# Patient Record
Sex: Male | Born: 1985 | Hispanic: Yes | Marital: Single
Health system: Southern US, Community
[De-identification: ages and names within clinical notes are randomized; demographics above are authoritative.]

---

## 2018-03-05 ENCOUNTER — Emergency Department (HOSPITAL_COMMUNITY): Payer: Self-pay

## 2018-03-05 ENCOUNTER — Encounter (HOSPITAL_COMMUNITY): Payer: Self-pay | Admitting: Emergency Medicine

## 2018-03-05 ENCOUNTER — Emergency Department (HOSPITAL_COMMUNITY)
Admission: EM | Admit: 2018-03-05 | Discharge: 2018-03-05 | Disposition: A | Discharge status: Home / Self Care | Payer: Self-pay | Attending: Emergency Medicine | Admitting: Emergency Medicine

## 2018-03-05 DIAGNOSIS — Y929 Unspecified place or not applicable: Secondary | ICD-10-CM | POA: Insufficient documentation

## 2018-03-05 DIAGNOSIS — Y999 Unspecified external cause status: Secondary | ICD-10-CM | POA: Insufficient documentation

## 2018-03-05 DIAGNOSIS — S92424A Nondisplaced fracture of distal phalanx of right great toe, initial encounter for closed fracture: Secondary | ICD-10-CM | POA: Insufficient documentation

## 2018-03-05 DIAGNOSIS — W208XXA Other cause of strike by thrown, projected or falling object, initial encounter: Secondary | ICD-10-CM | POA: Insufficient documentation

## 2018-03-05 DIAGNOSIS — Y939 Activity, unspecified: Secondary | ICD-10-CM | POA: Insufficient documentation

## 2018-03-05 NOTE — Discharge Instructions (Signed)
Buddy tape your toes for comfort, use the CAM walker for all weight bearing activities.  Ice and elevate the toe throughout the day, using ice pack for no more than 20 minutes every hour.  Alternate between tylenol and ibuprofen for pain relief. Follow up with your regular doctor in 1 week for recheck of symptoms. Return to the ER for changes or worsening symptoms.

## 2018-03-05 NOTE — ED Provider Notes (Signed)
[facility]-EMERGENCY DEPT Provider Note   CSN: [id number] Arrival date & time: 03/05/18  1657     History   Chief Complaint Chief Complaint  Patient presents with  . Toe Injury    HPI Vincent Bell is a 32 y.o. otherwise healthy male, who presents to the ED with complaints of right great toe pain since yesterday after he accidentally dropped a heavy aluminum lighting truss on his great toe from about a foot off the ground.  He describes the pain as 7/10 intermittent throbbing nonradiating right great toe pain which worsens with movement of the toe and walking, and has been moderately improved with Tylenol and ice.  He reports associated right great toe swelling and bruising.  He denies any wounds, numbness, tingling, focal weakness, or any other complaints or injuries at this time.  The history is provided by the patient and medical records. No language interpreter was used.  Toe Pain  This is a new problem. The current episode started yesterday. The problem occurs daily. The problem has not changed since onset.The symptoms are aggravated by walking and standing. The symptoms are relieved by acetaminophen and ice. He has tried acetaminophen and a cold compress for the symptoms. The treatment provided moderate relief.    History reviewed. No pertinent past medical history.  There are no active problems to display for this patient.   History reviewed. No pertinent surgical history.      Home Medications    Prior to Admission medications   Not on File    Family History History reviewed. No pertinent family history.  Social History Social History   Tobacco Use  . Smoking status: Not on file  Substance Use Topics  . Alcohol use: Not on file  . Drug use: Not on file     Allergies   Patient has no allergy information on record.   Review of Systems Review of Systems  Musculoskeletal: Positive for arthralgias and joint swelling.  Skin: Positive  for color change. Negative for wound.  Allergic/Immunologic: Negative for immunocompromised state.  Neurological: Negative for weakness and numbness.  Psychiatric/Behavioral: Negative for confusion.     Physical Exam Updated Vital Signs BP 135/79 (BP Location: Left Arm)   Pulse 61   Temp 98.2 F (36.8 C) (Oral)   Resp 18   SpO2 100%   Physical Exam  Constitutional: He is oriented to person, place, and time. Vital signs are normal. He appears well-developed and well-nourished.  Non-toxic appearance. No distress.  Afebrile, nontoxic, NAD  HENT:  Head: Normocephalic and atraumatic.  Mouth/Throat: Mucous membranes are normal.  Eyes: Conjunctivae and EOM are normal. Right eye exhibits no discharge. Left eye exhibits no discharge.  Neck: Normal range of motion. Neck supple.  Cardiovascular: Normal rate and intact distal pulses.  Pulmonary/Chest: Effort normal. No respiratory distress.  Abdominal: Normal appearance. He exhibits no distension.  Musculoskeletal:       Right foot: There is decreased range of motion (due to pain), tenderness, bony tenderness and swelling. There is normal capillary refill, no crepitus, no deformity and no laceration.  R foot with mild swelling and significant bruising to R great toe, moderate TTP to the great toe only but no other areas of tenderness to remainder of foot/toes, no crepitus/deformity, no wounds, still able to wiggle toes slightly but somewhat limited due to pain. Sensation grossly intact, distal pulses and cap refill intact, soft compartments.   Neurological: He is alert and oriented to person, place, and  time. He has normal strength. No sensory deficit.  Skin: Skin is warm, dry and intact. No rash noted.  Psychiatric: He has a normal mood and affect.  Nursing note and vitals reviewed.    ED Treatments / Results  Labs (all labs ordered are listed, but only abnormal results are displayed) Labs Reviewed - No data to  display  EKG None  Radiology Dg Foot Complete Right  Result Date: 03/05/2018 CLINICAL DATA:  Patient dropped something on the great total yesterday. Hematoma of the great toe. EXAM: RIGHT FOOT COMPLETE - 3+ VIEW COMPARISON:  None. FINDINGS: There are fractures involving the tuft and lateral base of the distal phalanx of the right great toe. There is intra-articular extension of the fracture at the base of the great toe into the interphalangeal joint. No joint dislocation is identified. Soft tissue swelling of the forefoot is noted more so along the great toe. A bipartite lateral sesamoid is seen at the head of the first metatarsal characterized by it's sclerotic margins. Deformity and truncated appearance of the second distal phalangeal tuft appears to be related to old remote trauma. IMPRESSION: 1. There are acute appearing fractures involving the tuft and lateral base of the distal phalanx of the right great toe. There is intra-articular extension of the fracture at the base of the great toe into the interphalangeal joint. 2. Soft tissue swelling of the forefoot. Electronically Signed   By: Tollie Ethavid  Kwon M.D.   On: 03/05/2018 17:51    Procedures Procedures (including critical care time)  Medications Ordered in ED Medications - No data to display   Initial Impression / Assessment and Plan / ED Course  I have reviewed the triage vital signs and the nursing notes.  Pertinent labs & imaging results that were available during my care of the patient were reviewed by me and considered in my medical decision making (see chart for details).     32 y.o. male here with R great toe injury sustained yesterday when he dropped an aluminum heavy object on his toe. On exam, mild swelling and significant bruising to R great toe, moderate TTP, no crepitus/deformity, no wounds, still able to wiggle toes slightly but limited due to pain; NVI with soft compartments. No other areas of tenderness to the remainder of  the foot/toes. Xray shows fx of tuft and lateral base of distal phalanx with some intraarticular extension. Will buddy tape and give CAM walker for comfort with weight bearing, advised tylenol/motrin/RICE, and f/up with his PCP in 1wk. I explained the diagnosis and have given explicit precautions to return to the ER including for any other new or worsening symptoms. The patient understands and accepts the medical plan as it's been dictated and I have answered their questions. Discharge instructions concerning home care and prescriptions have been given. The patient is STABLE and is discharged to home in good condition.    Final Clinical Impressions(s) / ED Diagnoses   Final diagnoses:  Closed nondisplaced fracture of distal phalanx of right great toe, initial encounter    ED Discharge Orders    [address]None       Abria Vannostrand, Grand Falls PlazaMercedes, [place] JerseyPA-C 03/05/18 Larence Penning1835    [place], Scott, MD 03/05/18 [phone]

## 2018-03-05 NOTE — ED Triage Notes (Signed)
Pt said he dropped something heavy on his right foot yesterday at work. Bruising and swelling present on first and second toe. Unable to bend toes.

## 2019-01-01 IMAGING — CR DG FOOT COMPLETE 3+V*R*
3 series · 3 of 3 positions shown · non-contrast
Comparison: None.

CLINICAL DATA: Patient dropped something on the great total
yesterday. Hematoma of the great toe.

EXAM:
RIGHT FOOT COMPLETE - 3+ VIEW

[x foot ap right]
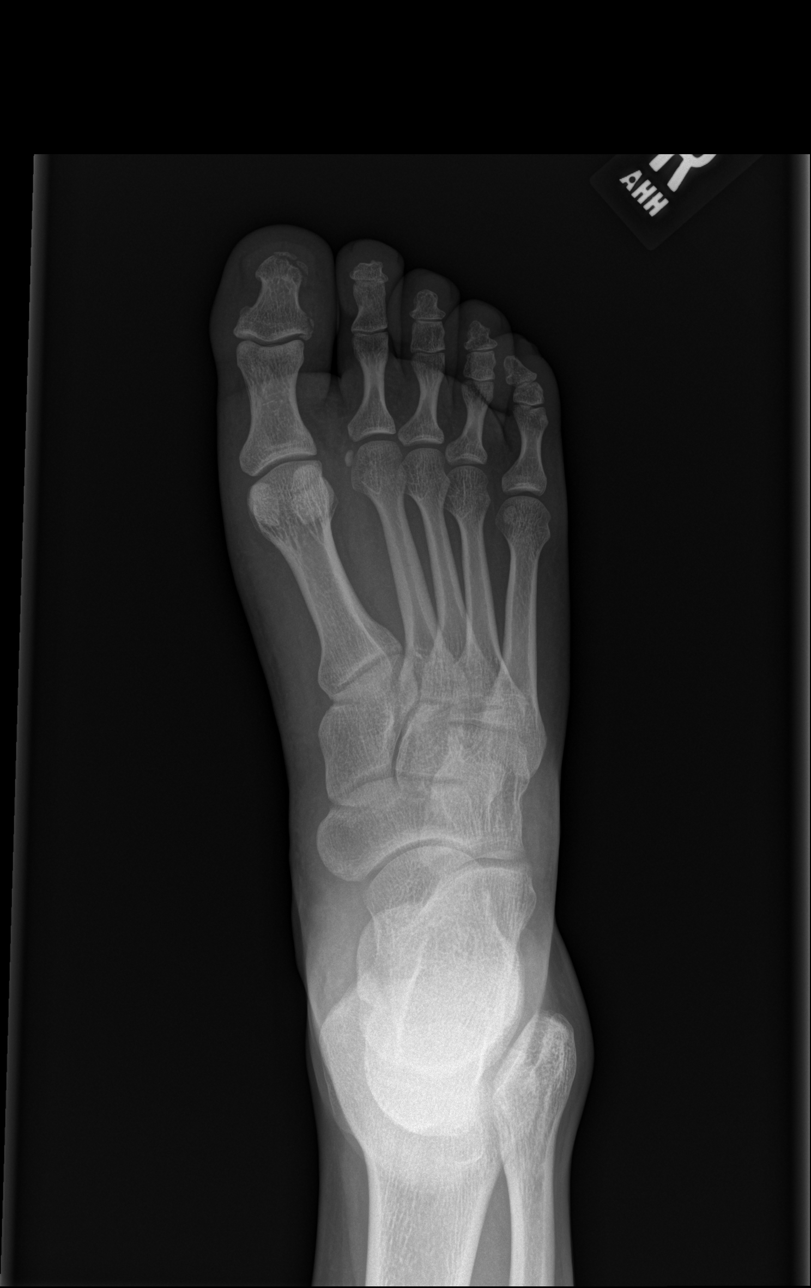

[x foot obl right]
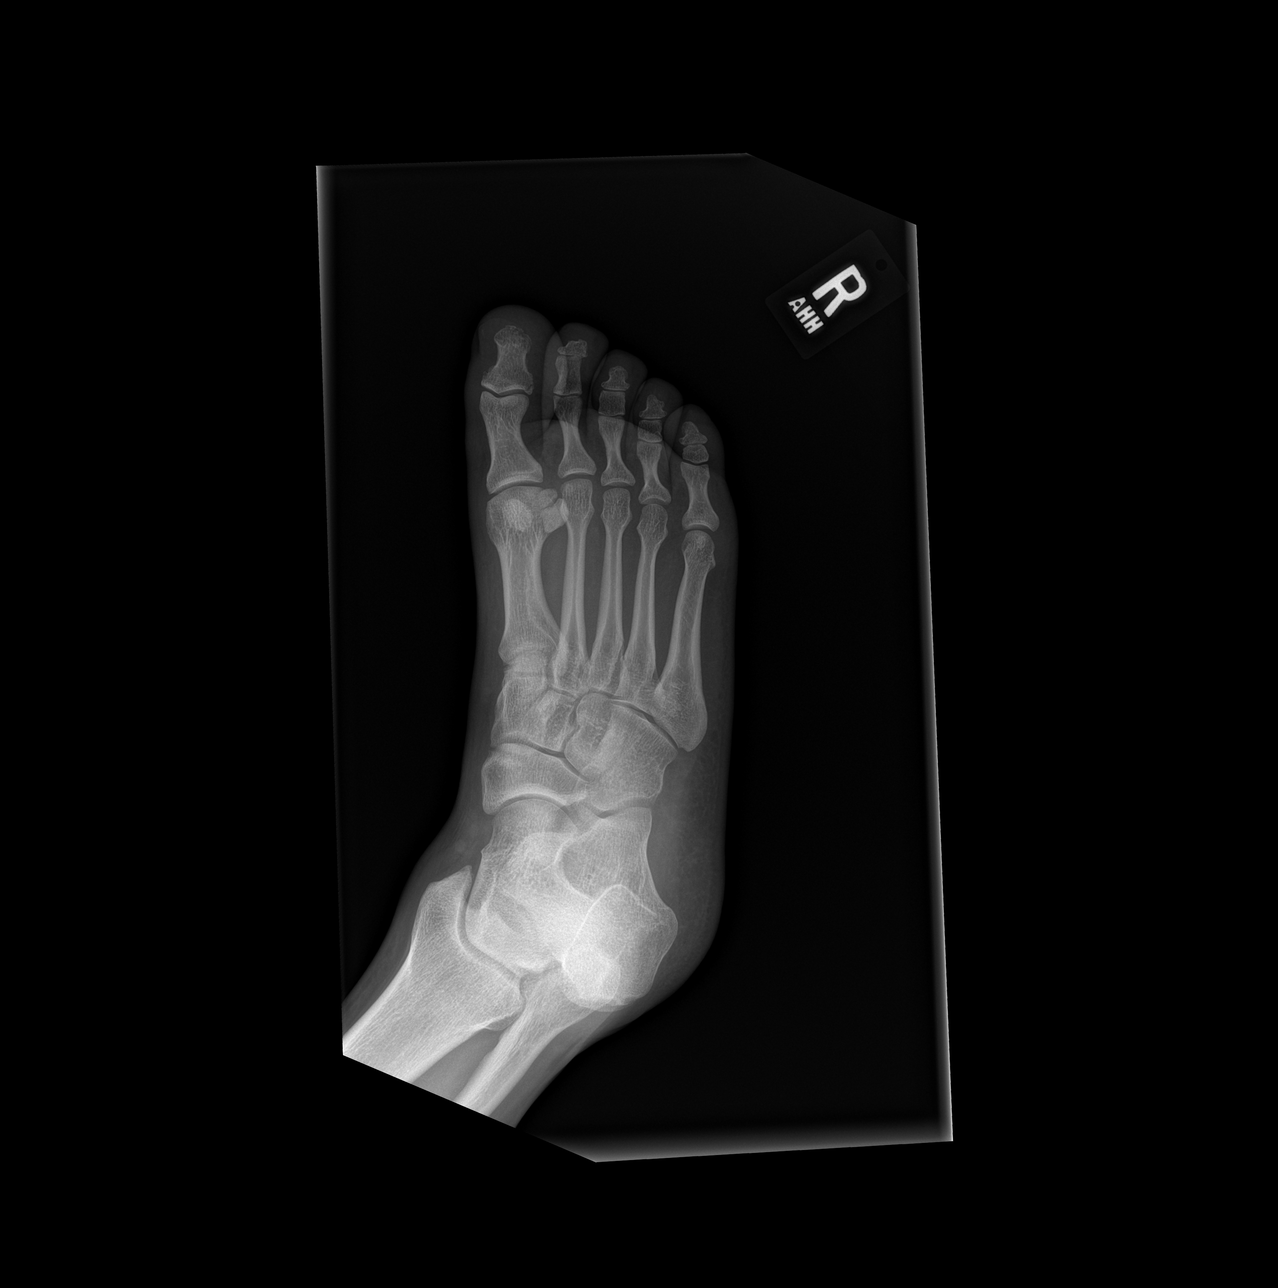

[x foot lat right]
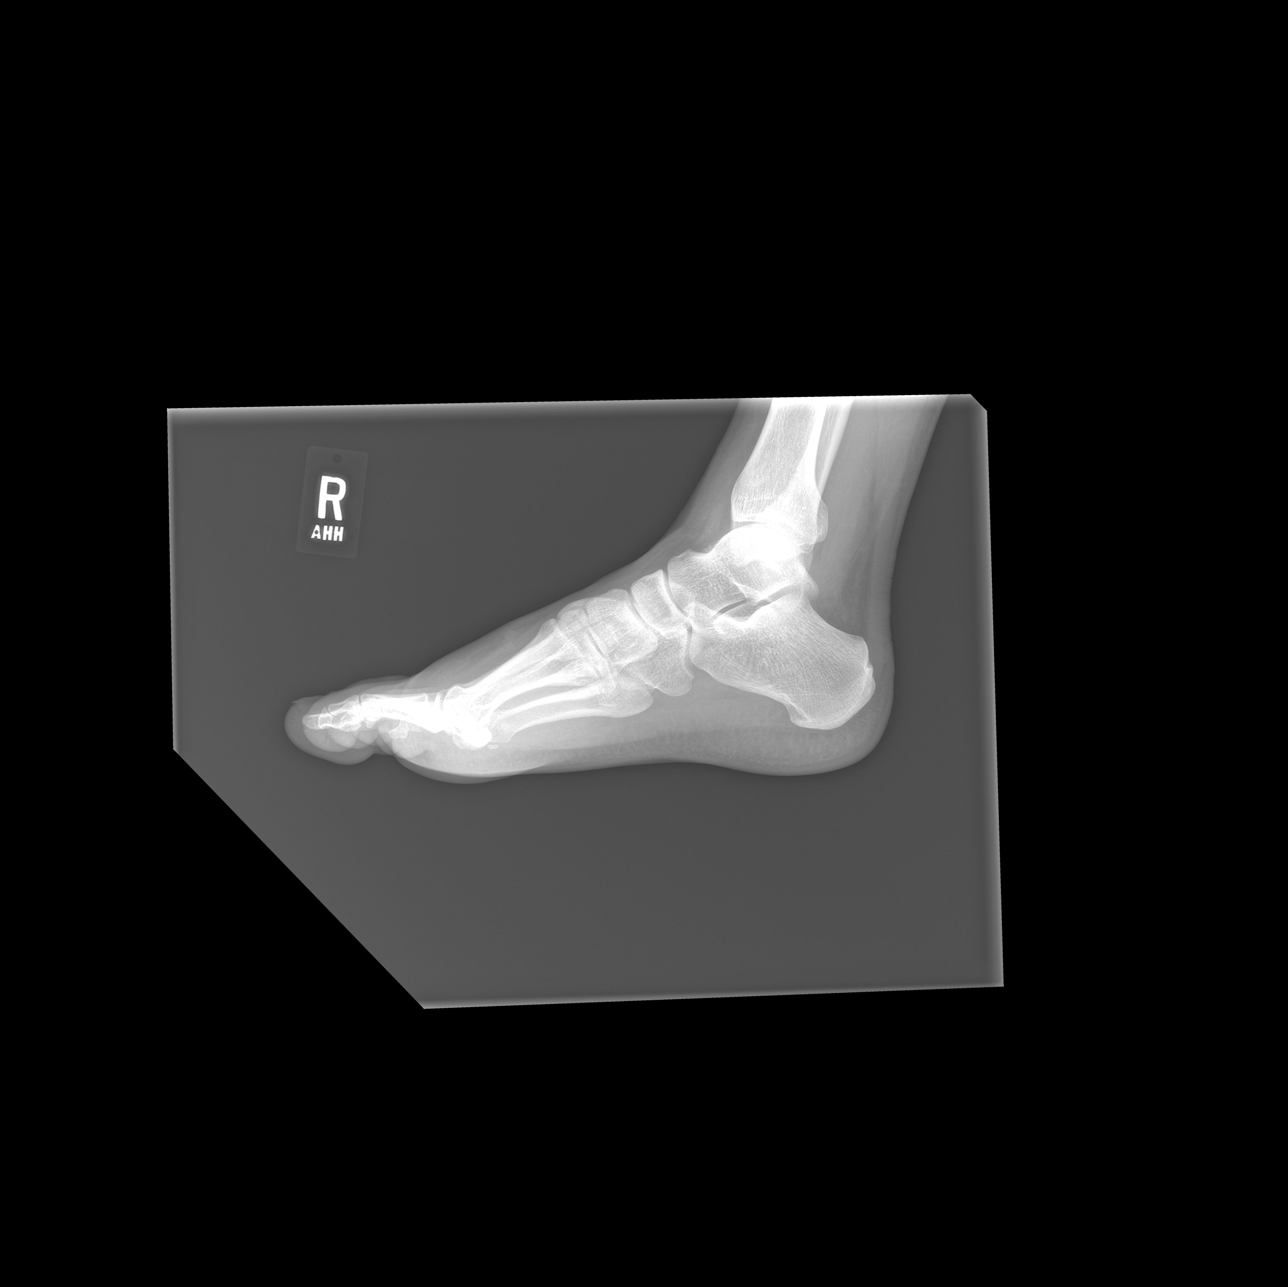

[3 of 3 positions shown; findings below may reference images not displayed]

FINDINGS: There are fractures involving the tuft and lateral base of the
distal phalanx of the right great toe. There is intra-articular
extension of the fracture at the base of the great toe into the
interphalangeal joint. No joint dislocation is identified. Soft
tissue swelling of the forefoot is noted more so along the great
toe. A bipartite lateral sesamoid is seen at the head of the first
metatarsal characterized by it's sclerotic margins. Deformity and
truncated appearance of the second distal phalangeal tuft appears to
be related to old remote trauma.
IMPRESSION: 1. There are acute appearing fractures involving the tuft and
lateral base of the distal phalanx of the right great toe. There is
intra-articular extension of the fracture at the base of the great
toe into the interphalangeal joint.
2. Soft tissue swelling of the forefoot.
